# Patient Record
Sex: Female | Born: 2000 | Race: Black or African American | Hispanic: No | Marital: Single | Smoking: Never smoker
Health system: Southern US, Community
[De-identification: ages and names within clinical notes are randomized; demographics above are authoritative.]

## PROBLEM LIST (undated history)

## (undated) DIAGNOSIS — J45909 Unspecified asthma, uncomplicated: Secondary | ICD-10-CM

## (undated) DIAGNOSIS — L309 Dermatitis, unspecified: Secondary | ICD-10-CM

---
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
## 2020-05-06 DIAGNOSIS — J0100 Acute maxillary sinusitis, unspecified: Secondary | ICD-10-CM | POA: Diagnosis not present

## 2020-05-06 DIAGNOSIS — Z20828 Contact with and (suspected) exposure to other viral communicable diseases: Secondary | ICD-10-CM | POA: Diagnosis not present

## 2020-06-03 DIAGNOSIS — Z20828 Contact with and (suspected) exposure to other viral communicable diseases: Secondary | ICD-10-CM | POA: Diagnosis not present

## 2020-06-03 DIAGNOSIS — J029 Acute pharyngitis, unspecified: Secondary | ICD-10-CM | POA: Diagnosis not present

## 2020-08-09 DIAGNOSIS — M25562 Pain in left knee: Secondary | ICD-10-CM | POA: Diagnosis not present

## 2020-08-09 DIAGNOSIS — M25561 Pain in right knee: Secondary | ICD-10-CM | POA: Diagnosis not present

## 2021-01-21 DIAGNOSIS — J309 Allergic rhinitis, unspecified: Secondary | ICD-10-CM | POA: Diagnosis not present

## 2021-01-21 DIAGNOSIS — R0981 Nasal congestion: Secondary | ICD-10-CM | POA: Diagnosis not present

## 2021-01-23 DIAGNOSIS — J301 Allergic rhinitis due to pollen: Secondary | ICD-10-CM | POA: Diagnosis not present

## 2021-01-24 DIAGNOSIS — J329 Chronic sinusitis, unspecified: Secondary | ICD-10-CM | POA: Diagnosis not present

## 2021-01-24 DIAGNOSIS — R519 Headache, unspecified: Secondary | ICD-10-CM | POA: Diagnosis not present

## 2021-01-24 DIAGNOSIS — J309 Allergic rhinitis, unspecified: Secondary | ICD-10-CM | POA: Diagnosis not present

## 2021-02-20 DIAGNOSIS — Z20822 Contact with and (suspected) exposure to covid-19: Secondary | ICD-10-CM | POA: Diagnosis not present

## 2021-03-06 DIAGNOSIS — J309 Allergic rhinitis, unspecified: Secondary | ICD-10-CM | POA: Diagnosis not present

## 2021-03-06 DIAGNOSIS — F411 Generalized anxiety disorder: Secondary | ICD-10-CM | POA: Diagnosis not present

## 2021-03-06 DIAGNOSIS — R110 Nausea: Secondary | ICD-10-CM | POA: Diagnosis not present

## 2021-03-06 DIAGNOSIS — R0981 Nasal congestion: Secondary | ICD-10-CM | POA: Diagnosis not present

## 2021-03-06 DIAGNOSIS — Z1331 Encounter for screening for depression: Secondary | ICD-10-CM | POA: Diagnosis not present

## 2021-03-06 DIAGNOSIS — Z0001 Encounter for general adult medical examination with abnormal findings: Secondary | ICD-10-CM | POA: Diagnosis not present

## 2021-03-06 DIAGNOSIS — F40298 Other specified phobia: Secondary | ICD-10-CM | POA: Diagnosis not present

## 2021-03-06 DIAGNOSIS — R519 Headache, unspecified: Secondary | ICD-10-CM | POA: Diagnosis not present

## 2021-03-06 DIAGNOSIS — N921 Excessive and frequent menstruation with irregular cycle: Secondary | ICD-10-CM | POA: Diagnosis not present

## 2021-03-06 DIAGNOSIS — H53149 Visual discomfort, unspecified: Secondary | ICD-10-CM | POA: Diagnosis not present

## 2021-03-06 DIAGNOSIS — E782 Mixed hyperlipidemia: Secondary | ICD-10-CM | POA: Diagnosis not present

## 2021-03-06 DIAGNOSIS — Z0000 Encounter for general adult medical examination without abnormal findings: Secondary | ICD-10-CM | POA: Diagnosis not present

## 2021-03-06 DIAGNOSIS — J329 Chronic sinusitis, unspecified: Secondary | ICD-10-CM | POA: Diagnosis not present

## 2021-09-02 DIAGNOSIS — J309 Allergic rhinitis, unspecified: Secondary | ICD-10-CM | POA: Diagnosis not present

## 2021-09-02 DIAGNOSIS — J329 Chronic sinusitis, unspecified: Secondary | ICD-10-CM | POA: Diagnosis not present

## 2021-09-24 DIAGNOSIS — R768 Other specified abnormal immunological findings in serum: Secondary | ICD-10-CM | POA: Diagnosis not present

## 2021-09-24 DIAGNOSIS — J309 Allergic rhinitis, unspecified: Secondary | ICD-10-CM | POA: Diagnosis not present

## 2021-09-24 DIAGNOSIS — L509 Urticaria, unspecified: Secondary | ICD-10-CM | POA: Diagnosis not present

## 2021-10-13 DIAGNOSIS — J309 Allergic rhinitis, unspecified: Secondary | ICD-10-CM | POA: Diagnosis not present

## 2021-12-19 DIAGNOSIS — J309 Allergic rhinitis, unspecified: Secondary | ICD-10-CM | POA: Diagnosis not present

## 2021-12-19 DIAGNOSIS — L501 Idiopathic urticaria: Secondary | ICD-10-CM | POA: Diagnosis not present

## 2021-12-23 DIAGNOSIS — Z8709 Personal history of other diseases of the respiratory system: Secondary | ICD-10-CM | POA: Diagnosis not present

## 2021-12-23 DIAGNOSIS — T50905D Adverse effect of unspecified drugs, medicaments and biological substances, subsequent encounter: Secondary | ICD-10-CM | POA: Diagnosis not present

## 2021-12-23 DIAGNOSIS — J301 Allergic rhinitis due to pollen: Secondary | ICD-10-CM | POA: Diagnosis not present

## 2021-12-23 DIAGNOSIS — J3089 Other allergic rhinitis: Secondary | ICD-10-CM | POA: Diagnosis not present

## 2021-12-23 DIAGNOSIS — L2089 Other atopic dermatitis: Secondary | ICD-10-CM | POA: Diagnosis not present

## 2021-12-23 DIAGNOSIS — Z0000 Encounter for general adult medical examination without abnormal findings: Secondary | ICD-10-CM | POA: Diagnosis not present

## 2022-05-11 DIAGNOSIS — Z20822 Contact with and (suspected) exposure to covid-19: Secondary | ICD-10-CM | POA: Diagnosis not present

## 2022-05-11 DIAGNOSIS — J069 Acute upper respiratory infection, unspecified: Secondary | ICD-10-CM | POA: Diagnosis not present

## 2022-06-15 DIAGNOSIS — Z1152 Encounter for screening for COVID-19: Secondary | ICD-10-CM | POA: Diagnosis not present

## 2022-06-15 DIAGNOSIS — J069 Acute upper respiratory infection, unspecified: Secondary | ICD-10-CM | POA: Diagnosis not present

## 2022-07-28 DIAGNOSIS — E782 Mixed hyperlipidemia: Secondary | ICD-10-CM | POA: Diagnosis not present

## 2022-07-28 DIAGNOSIS — J069 Acute upper respiratory infection, unspecified: Secondary | ICD-10-CM | POA: Diagnosis not present

## 2022-07-28 DIAGNOSIS — J3089 Other allergic rhinitis: Secondary | ICD-10-CM | POA: Diagnosis not present

## 2022-08-04 DIAGNOSIS — J301 Allergic rhinitis due to pollen: Secondary | ICD-10-CM | POA: Diagnosis not present

## 2022-08-04 DIAGNOSIS — E7801 Familial hypercholesterolemia: Secondary | ICD-10-CM | POA: Diagnosis not present

## 2022-08-24 DIAGNOSIS — J069 Acute upper respiratory infection, unspecified: Secondary | ICD-10-CM | POA: Diagnosis not present

## 2022-10-13 ENCOUNTER — Encounter: Payer: Self-pay | Admitting: Internal Medicine

## 2022-10-13 ENCOUNTER — Ambulatory Visit (INDEPENDENT_AMBULATORY_CARE_PROVIDER_SITE_OTHER): Payer: No Typology Code available for payment source | Admitting: Internal Medicine

## 2022-10-13 VITALS — BP 158/88 | HR 56 | Ht 67.0 in | Wt 186.0 lb

## 2022-10-13 DIAGNOSIS — J301 Allergic rhinitis due to pollen: Secondary | ICD-10-CM

## 2022-10-13 DIAGNOSIS — E782 Mixed hyperlipidemia: Secondary | ICD-10-CM | POA: Insufficient documentation

## 2022-10-13 DIAGNOSIS — I10 Essential (primary) hypertension: Secondary | ICD-10-CM

## 2022-10-13 LAB — POCT URINALYSIS DIPSTICK
Bilirubin, UA: NEGATIVE
Blood, UA: NEGATIVE
Glucose, UA: NEGATIVE
Ketones, UA: NEGATIVE
Leukocytes, UA: NEGATIVE
Nitrite, UA: NEGATIVE
Protein, UA: NEGATIVE
Spec Grav, UA: 1.025 (ref 1.010–1.025)
Urobilinogen, UA: 0.2 E.U./dL
pH, UA: 6.5 (ref 5.0–8.0)

## 2022-10-13 MED ORDER — AMLODIPINE BESYLATE 5 MG PO TABS: 5.0000 mg | ORAL_TABLET | Freq: Every day | ORAL | 11 refills | Status: DC

## 2022-10-13 NOTE — Progress Notes (Signed)
Established Patient Office Visit  Subjective:  Patient ID: Cindy Patton, female    DOB: December 02, 2000  Age: 22 y.o. MRN: [id number]  Chief Complaint  Patient presents with   Follow-up    BP elevated    Patient comes in today with reports of high blood pressure.  Patient goes to school in [place].  Earlier this year she was having frequent sinus infections so she went to her student health care center where she was told that her blood pressure is high and she should address this with her primary care.  Patient states that she forgot but however now she is home at spring break she took her mother's blood pressure monitor and checked her blood pressure which was very high.  Today her blood pressure in office is 158/88. At her previous 3 visits to this office her blood pressure has been within normal limits.  She does have some weight gain over a year and a half. Patient reports she does get some headaches but that are not uncommon for her.  She denies any chest pain, no shortness of breath, no palpitations.  Patient has not been taking any over-the-counter cold and sinus medications with decongestants.  She also denies taking ibuprofen Motrin or Aleve.  Patient is not currently only on any hormonal birth control.Admits to eating more salt recently. Advised to cut back. She denies any flank pain and no blood in her urine.  She is not constipated and does not have any diarrhea.  She denies any rash or joint pains recently.     Past Medical History:  Diagnosis Date   Asthma    Eczema     No past surgical history on file.  Social History   Socioeconomic History   Marital status: Single    Spouse name: Not on file   Number of children: Not on file   Years of education: Not on file   Highest education level: Not on file  Occupational History   Not on file  Tobacco Use   Smoking status: Never   Smokeless tobacco: Never  Substance and Sexual Activity   Alcohol use: Never   Drug  use: Never   Sexual activity: Never  Other Topics Concern   Not on file  Social History Narrative   Not on file   Social Determinants of Health   Financial Resource Strain: Not on file  Food Insecurity: Not on file  Transportation Needs: Not on file  Physical Activity: Not on file  Stress: Not on file  Social Connections: Not on file  Intimate Partner Violence: Not on file    Family History  Problem Relation Age of Onset   Hypertension Mother    Breast cancer Mother     Allergies  Allergen Reactions   Ibuprofen Hives    Review of Systems  Constitutional: Negative.   HENT: Negative.    Eyes: Negative.   Respiratory: Negative.    Cardiovascular: Negative.   Gastrointestinal: Negative.   Genitourinary: Negative.   Musculoskeletal: Negative.   Skin: Negative.   Neurological: Negative.   Endo/Heme/Allergies: Negative.   Psychiatric/Behavioral: Negative.         Objective:   BP (!) 158/88   Pulse (!) 56   Ht '5\' 7"'$  (1.702 m)   Wt 186 lb (84.4 kg)   SpO2 98%   BMI 29.13 kg/m   Vitals:   10/13/22 1441  BP: (!) 158/88  Pulse: (!) 56  Height: '5\' 7"'$  (1.702 m)  Weight: 186 lb (84.4 kg)  SpO2: 98%  BMI (Calculated): 29.12    Physical Exam Vitals and nursing note reviewed.  Constitutional:      Appearance: Normal appearance.  HENT:     Head: Normocephalic and atraumatic.  Cardiovascular:     Rate and Rhythm: Regular rhythm. Tachycardia present.     Pulses: Normal pulses.     Heart sounds: Normal heart sounds.  Pulmonary:     Effort: Pulmonary effort is normal.     Breath sounds: Normal breath sounds.  Abdominal:     General: Abdomen is flat. Bowel sounds are normal.     Palpations: Abdomen is soft.  Musculoskeletal:        General: Normal range of motion.     Cervical back: Normal range of motion and neck supple.  Neurological:     General: No focal deficit present.     Mental Status: She is alert and oriented to person, place, and time. Mental  status is at baseline.      Results for orders placed or performed in visit on 10/13/22  POCT Urinalysis Dipstick (81002)  Result Value Ref Range   Color, UA     Clarity, UA     Glucose, UA Negative Negative   Bilirubin, UA Negative    Ketones, UA Negative    Spec Grav, UA 1.025 1.010 - 1.025   Blood, UA Negative    pH, UA 6.5 5.0 - 8.0   Protein, UA Negative Negative   Urobilinogen, UA 0.2 0.2 or 1.0 E.U./dL   Nitrite, UA Negative    Leukocytes, UA Negative Negative   Appearance     Odor      Recent Results (from the past 2160 hour(s))  POCT Urinalysis Dipstick [id number])     Status: Normal   Collection Time: 10/13/22  3:20 PM  Result Value Ref Range   Color, UA     Clarity, UA     Glucose, UA Negative Negative   Bilirubin, UA Negative    Ketones, UA Negative    Spec Grav, UA 1.025 1.010 - 1.025   Blood, UA Negative    pH, UA 6.5 5.0 - 8.0   Protein, UA Negative Negative   Urobilinogen, UA 0.2 0.2 or 1.0 E.U./dL   Nitrite, UA Negative    Leukocytes, UA Negative Negative   Appearance     Odor        Assessment & Plan:  This is new onset hypertension in a young adult.  She does have a family history of high blood pressure.  However she presented with very high blood pressure at this time.  Will start amlodipine 5 mg p.o. daily She will need blood work.  And she will also need evaluation for renal artery stenosis and adrenal tumors.  Patient will be getting an echocardiogram.  These will be scheduled but due to  time restriction she might need to set up some of these tests at [facility] where she has to return by the end of this week. Problem List Items Addressed This Visit     Primary hypertension - Primary   Relevant Medications   amLODipine (NORVASC) 5 MG tablet   Other Relevant Orders   EKG 12-Lead   CBC With Differential   CMP14+EGFR   TSH   PCV ECHOCARDIOGRAM COMPLETE   Cortisol, free, Serum   Aldosterone/Renin   Metanephrines, plasma   US Renal  Artery Stenosis   POCT Urinalysis Dipstick [id number]) (Completed)  Mixed hyperlipidemia   Relevant Medications   amLODipine (NORVASC) 5 MG tablet   Seasonal allergic rhinitis due to pollen    Follow up 3 days.   Total time spent: 45 minutes  Perrin Maltese, MD  10/13/2022

## 2022-10-16 ENCOUNTER — Ambulatory Visit (INDEPENDENT_AMBULATORY_CARE_PROVIDER_SITE_OTHER): Payer: No Typology Code available for payment source

## 2022-10-16 DIAGNOSIS — I351 Nonrheumatic aortic (valve) insufficiency: Secondary | ICD-10-CM | POA: Diagnosis not present

## 2022-10-16 DIAGNOSIS — I361 Nonrheumatic tricuspid (valve) insufficiency: Secondary | ICD-10-CM | POA: Diagnosis not present

## 2022-10-16 DIAGNOSIS — I10 Essential (primary) hypertension: Secondary | ICD-10-CM

## 2022-10-17 LAB — CBC WITH DIFFERENTIAL
Basophils Absolute: 0 10*3/uL (ref 0.0–0.2)
Basos: 1 %
EOS (ABSOLUTE): 0.2 10*3/uL (ref 0.0–0.4)
Eos: 4 %
Hematocrit: 41.4 % (ref 34.0–46.6)
Hemoglobin: 13.5 g/dL (ref 11.1–15.9)
Immature Grans (Abs): 0 10*3/uL (ref 0.0–0.1)
Immature Granulocytes: 0 %
Lymphocytes Absolute: 1.8 10*3/uL (ref 0.7–3.1)
Lymphs: 39 %
MCH: 30.5 pg (ref 26.6–33.0)
MCHC: 32.6 g/dL (ref 31.5–35.7)
MCV: 94 fL (ref 79–97)
Monocytes Absolute: 0.5 10*3/uL (ref 0.1–0.9)
Monocytes: 10 %
Neutrophils Absolute: 2 10*3/uL (ref 1.4–7.0)
Neutrophils: 46 %
RBC: 4.43 x10E6/uL (ref 3.77–5.28)
RDW: 11.8 % (ref 11.7–15.4)
WBC: 4.5 10*3/uL (ref 3.4–10.8)

## 2022-10-17 LAB — ALDOSTERONE + RENIN ACTIVITY W/ RATIO
Aldos/Renin Ratio: <4.7 (ref 0.0–30.0)
Aldosterone: <1 ng/dL (ref 0.0–30.0)
Renin Activity, Plasma: 0.214 ng/mL/hr (ref 0.167–5.380)

## 2022-10-17 LAB — CMP14+EGFR
ALT: 16 IU/L (ref 0–32)
AST: 20 IU/L (ref 0–40)
Albumin/Globulin Ratio: 1.6 (ref 1.2–2.2)
Albumin: 4.5 g/dL (ref 4.0–5.0)
Alkaline Phosphatase: 72 IU/L (ref 44–121)
BUN/Creatinine Ratio: 19 (ref 9–23)
BUN: 17 mg/dL (ref 6–20)
Bilirubin Total: <0.2 mg/dL (ref 0.0–1.2)
CO2: 22 mmol/L (ref 20–29)
Calcium: 9.6 mg/dL (ref 8.7–10.2)
Chloride: 103 mmol/L (ref 96–106)
Creatinine, Ser: 0.91 mg/dL (ref 0.57–1.00)
Globulin, Total: 2.9 g/dL (ref 1.5–4.5)
Glucose: 77 mg/dL (ref 70–99)
Potassium: 4.7 mmol/L (ref 3.5–5.2)
Sodium: 137 mmol/L (ref 134–144)
Total Protein: 7.4 g/dL (ref 6.0–8.5)
eGFR: 92 mL/min/{1.73_m2} (ref >59)

## 2022-10-17 LAB — CORTISOL, FREE: Cortisol, Free Dialysis, LCMS: 0.285 ug/dL

## 2022-10-17 LAB — TSH: TSH: 0.74 u[IU]/mL (ref 0.450–4.500)

## 2022-10-23 DIAGNOSIS — Z01419 Encounter for gynecological examination (general) (routine) without abnormal findings: Secondary | ICD-10-CM | POA: Diagnosis not present

## 2022-10-30 DIAGNOSIS — N926 Irregular menstruation, unspecified: Secondary | ICD-10-CM | POA: Diagnosis not present

## 2022-11-12 DIAGNOSIS — F4320 Adjustment disorder, unspecified: Secondary | ICD-10-CM | POA: Diagnosis not present

## 2022-11-26 DIAGNOSIS — F4320 Adjustment disorder, unspecified: Secondary | ICD-10-CM | POA: Diagnosis not present

## 2022-12-10 DIAGNOSIS — F4320 Adjustment disorder, unspecified: Secondary | ICD-10-CM | POA: Diagnosis not present

## 2023-01-21 DIAGNOSIS — F4322 Adjustment disorder with anxiety: Secondary | ICD-10-CM | POA: Diagnosis not present

## 2023-02-10 DIAGNOSIS — Z0100 Encounter for examination of eyes and vision without abnormal findings: Secondary | ICD-10-CM | POA: Diagnosis not present

## 2023-02-10 DIAGNOSIS — H5213 Myopia, bilateral: Secondary | ICD-10-CM | POA: Diagnosis not present

## 2023-02-18 DIAGNOSIS — F4322 Adjustment disorder with anxiety: Secondary | ICD-10-CM | POA: Diagnosis not present

## 2023-05-14 ENCOUNTER — Other Ambulatory Visit: Payer: Self-pay | Admitting: Internal Medicine

## 2023-06-12 DIAGNOSIS — R519 Headache, unspecified: Secondary | ICD-10-CM | POA: Diagnosis not present

## 2023-06-12 DIAGNOSIS — J019 Acute sinusitis, unspecified: Secondary | ICD-10-CM | POA: Diagnosis not present

## 2023-06-12 DIAGNOSIS — J311 Chronic nasopharyngitis: Secondary | ICD-10-CM | POA: Diagnosis not present

## 2023-11-06 ENCOUNTER — Other Ambulatory Visit: Payer: Self-pay | Admitting: Internal Medicine

## 2023-11-06 DIAGNOSIS — I10 Essential (primary) hypertension: Secondary | ICD-10-CM

## 2023-11-10 DIAGNOSIS — F411 Generalized anxiety disorder: Secondary | ICD-10-CM | POA: Diagnosis not present

## 2023-11-17 DIAGNOSIS — F411 Generalized anxiety disorder: Secondary | ICD-10-CM | POA: Diagnosis not present

## 2023-11-24 DIAGNOSIS — F411 Generalized anxiety disorder: Secondary | ICD-10-CM | POA: Diagnosis not present

## 2023-12-06 ENCOUNTER — Other Ambulatory Visit: Payer: Self-pay | Admitting: Internal Medicine

## 2023-12-06 DIAGNOSIS — I10 Essential (primary) hypertension: Secondary | ICD-10-CM

## 2023-12-07 ENCOUNTER — Other Ambulatory Visit: Payer: Self-pay | Admitting: Internal Medicine

## 2023-12-11 DIAGNOSIS — F411 Generalized anxiety disorder: Secondary | ICD-10-CM | POA: Diagnosis not present

## 2023-12-15 DIAGNOSIS — F411 Generalized anxiety disorder: Secondary | ICD-10-CM | POA: Diagnosis not present

## 2023-12-29 DIAGNOSIS — F411 Generalized anxiety disorder: Secondary | ICD-10-CM | POA: Diagnosis not present

## 2024-01-06 DIAGNOSIS — F411 Generalized anxiety disorder: Secondary | ICD-10-CM | POA: Diagnosis not present

## 2024-06-17 ENCOUNTER — Other Ambulatory Visit: Payer: Self-pay | Admitting: Internal Medicine

## 2024-06-17 DIAGNOSIS — I10 Essential (primary) hypertension: Secondary | ICD-10-CM
